# Patient Record
Sex: Female | Born: 1985 | Race: Black or African American | Hispanic: No | Smoking: Former smoker
Health system: Southern US, Community
[De-identification: ages and names within clinical notes are randomized; demographics above are authoritative.]

## PROBLEM LIST (undated history)

## (undated) DIAGNOSIS — I499 Cardiac arrhythmia, unspecified: Secondary | ICD-10-CM

## (undated) DIAGNOSIS — I4891 Unspecified atrial fibrillation: Secondary | ICD-10-CM

## (undated) HISTORY — PX: BREAST LUMPECTOMY: SHX2

---

## 2020-12-11 ENCOUNTER — Other Ambulatory Visit (HOSPITAL_COMMUNITY): Payer: Self-pay | Admitting: Neurology

## 2020-12-11 DIAGNOSIS — R519 Headache, unspecified: Secondary | ICD-10-CM

## 2020-12-11 DIAGNOSIS — H538 Other visual disturbances: Secondary | ICD-10-CM

## 2020-12-11 DIAGNOSIS — I480 Paroxysmal atrial fibrillation: Secondary | ICD-10-CM

## 2020-12-11 DIAGNOSIS — G08 Intracranial and intraspinal phlebitis and thrombophlebitis: Secondary | ICD-10-CM

## 2020-12-25 ENCOUNTER — Ambulatory Visit (HOSPITAL_COMMUNITY): Admission: RE | Admit: 2020-12-25 | Payer: 59 | Source: Ambulatory Visit

## 2020-12-25 ENCOUNTER — Ambulatory Visit (HOSPITAL_COMMUNITY)
Admission: RE | Admit: 2020-12-25 | Discharge: 2020-12-25 | Disposition: A | Discharge status: Home / Self Care | Payer: 59 | Source: Ambulatory Visit | Attending: Neurology | Admitting: Neurology

## 2020-12-25 ENCOUNTER — Encounter (HOSPITAL_COMMUNITY): Payer: Self-pay

## 2020-12-25 DIAGNOSIS — R519 Headache, unspecified: Secondary | ICD-10-CM

## 2020-12-25 DIAGNOSIS — G08 Intracranial and intraspinal phlebitis and thrombophlebitis: Secondary | ICD-10-CM

## 2020-12-25 DIAGNOSIS — I480 Paroxysmal atrial fibrillation: Secondary | ICD-10-CM

## 2020-12-25 DIAGNOSIS — H538 Other visual disturbances: Secondary | ICD-10-CM

## 2021-11-12 ENCOUNTER — Ambulatory Visit: Payer: 59 | Admitting: General Surgery

## 2021-11-12 ENCOUNTER — Encounter: Payer: Self-pay | Admitting: General Surgery

## 2021-11-12 ENCOUNTER — Other Ambulatory Visit: Payer: Self-pay

## 2021-11-12 VITALS — BP 107/73 | HR 100 | Temp 98.9°F | Resp 14 | Ht 66.0 in | Wt 203.0 lb

## 2021-11-12 DIAGNOSIS — K439 Ventral hernia without obstruction or gangrene: Secondary | ICD-10-CM | POA: Diagnosis not present

## 2021-11-12 NOTE — Progress Notes (Signed)
Jamie Spears; [id number]; 11/23/1985 ? ? ?HPI ?Patient is a 35-year-old black female who was referred to my care by Rochelle Muse for evaluation and treatment of a ventral hernia.  Patient has had a hernia just superior to the her umbilicus over the past 3 years.  It is causing her increasing discomfort and is made worse with straining.  She was seen by another surgeon for repair several years ago.  She denies any nausea or vomiting. ?She states she does have a history of paroxysmal atrial fibrillation, though she has not not felt any recent palpitations.  She is not on any treatment. ?History reviewed. No pertinent past medical history. ? ?History reviewed. No pertinent surgical history. ? ?History reviewed. No pertinent family history. ? ?Current Outpatient Medications on File Prior to Visit  ?Medication Sig Dispense Refill  ? Magnesium 500 MG TABS Take 500 mg by mouth daily.    ? ?No current facility-administered medications on file prior to visit.  ? ? ?Allergies  ?Allergen Reactions  ? Acetaminophen Anaphylaxis  ? Codeine Anaphylaxis  ? ? ?Social History  ? ?Substance and Sexual Activity  ?Alcohol Use Never  ? ? ?Social History  ? ?Tobacco Use  ?Smoking Status Former  ? Types: Cigarettes  ? Passive exposure: Current  ?Smokeless Tobacco Never  ? ? ?Review of Systems  ?Constitutional: Negative.   ?HENT:  Positive for ear pain.   ?Eyes: Negative.   ?Respiratory:  Positive for shortness of breath.   ?Cardiovascular: Negative.   ?Gastrointestinal:  Positive for abdominal pain and heartburn.  ?Genitourinary: Negative.   ?Musculoskeletal: Negative.   ?Skin: Negative.   ?Neurological:  Positive for headaches.  ?Endo/Heme/Allergies: Negative.   ?Psychiatric/Behavioral: Negative.    ? ?Objective  ? ?Vitals:  ? 11/12/21 1308  ?BP: 107/73  ?Pulse: 100  ?Resp: 14  ?Temp: 98.9 ?F (37.2 ?C)  ?SpO2: 96%  ? ? ?Physical Exam ?Vitals reviewed.  ?Constitutional:   ?   Appearance: Normal appearance. She is not ill-appearing.   ?HENT:  ?   Head: Normocephalic and atraumatic.  ?Cardiovascular:  ?   Rate and Rhythm: Normal rate and regular rhythm.  ?   Heart sounds: Normal heart sounds. No murmur heard. ?  No friction rub. No gallop.  ?Pulmonary:  ?   Effort: Pulmonary effort is normal. No respiratory distress.  ?   Breath sounds: Normal breath sounds. No stridor. No wheezing, rhonchi or rales.  ?Abdominal:  ?   General: Bowel sounds are normal. There is no distension.  ?   Palpations: Abdomen is soft. There is no mass.  ?   Tenderness: There is no abdominal tenderness. There is no guarding or rebound.  ?   Hernia: A hernia is present.  ?   Comments: 3 cm reducible supraumbilical hernia.  This may be in continuity with the base of the umbilicus.  ?Skin: ?   General: Skin is warm and dry.  ?Neurological:  ?   Mental Status: She is alert and oriented to person, place, and time.  ? ? ?Assessment  ?Ventral hernia ?Plan  ?Patient will call to schedule a ventral herniorrhaphy with mesh.  The risks and benefits of the procedure including bleeding, infection, mesh use, and the possibility of recurrence of the hernia were fully explained to the patient, who gave informed consent. ?

## 2021-11-15 NOTE — H&P (Signed)
Jamie Spears; [id number]; 11/01/1985 ? ? ?HPI ?Patient is a 36 year old black female who was referred to my care by [facility] for evaluation and treatment of a ventral hernia.  Patient has had a hernia just superior to the her umbilicus over the past 3 years.  It is causing her increasing discomfort and is made worse with straining.  She was seen by another surgeon for repair several years ago.  She denies any nausea or vomiting. ?She states she does have a history of paroxysmal atrial fibrillation, though she has not not felt any recent palpitations.  She is not on any treatment. ?History reviewed. No pertinent past medical history. ? ?History reviewed. No pertinent surgical history. ? ?History reviewed. No pertinent family history. ? ?Current Outpatient Medications on File Prior to Visit  ?Medication Sig Dispense Refill  ? Magnesium 500 MG TABS Take 500 mg by mouth daily.    ? ?No current facility-administered medications on file prior to visit.  ? ? ?Allergies  ?Allergen Reactions  ? Acetaminophen Anaphylaxis  ? Codeine Anaphylaxis  ? ? ?Social History  ? ?Substance and Sexual Activity  ?Alcohol Use Never  ? ? ?Social History  ? ?Tobacco Use  ?Smoking Status Former  ? Types: Cigarettes  ? Passive exposure: Current  ?Smokeless Tobacco Never  ? ? ?Review of Systems  ?Constitutional: Negative.   ?HENT:  Positive for ear pain.   ?Eyes: Negative.   ?Respiratory:  Positive for shortness of breath.   ?Cardiovascular: Negative.   ?Gastrointestinal:  Positive for abdominal pain and heartburn.  ?Genitourinary: Negative.   ?Musculoskeletal: Negative.   ?Skin: Negative.   ?Neurological:  Positive for headaches.  ?Endo/Heme/Allergies: Negative.   ?Psychiatric/Behavioral: Negative.    ? ?Objective  ? ?Vitals:  ? 11/12/21 1308  ?BP: 107/73  ?Pulse: 100  ?Resp: 14  ?Temp: 98.9 ?F (37.2 ?C)  ?SpO2: 96%  ? ? ?Physical Exam ?Vitals reviewed.  ?Constitutional:   ?   Appearance: Normal appearance. She is not ill-appearing.   ?HENT:  ?   Head: Normocephalic and atraumatic.  ?Cardiovascular:  ?   Rate and Rhythm: Normal rate and regular rhythm.  ?   Heart sounds: Normal heart sounds. No murmur heard. ?  No friction rub. No gallop.  ?Pulmonary:  ?   Effort: Pulmonary effort is normal. No respiratory distress.  ?   Breath sounds: Normal breath sounds. No stridor. No wheezing, rhonchi or rales.  ?Abdominal:  ?   General: Bowel sounds are normal. There is no distension.  ?   Palpations: Abdomen is soft. There is no mass.  ?   Tenderness: There is no abdominal tenderness. There is no guarding or rebound.  ?   Hernia: A hernia is present.  ?   Comments: 3 cm reducible supraumbilical hernia.  This may be in continuity with the base of the umbilicus.  ?Skin: ?   General: Skin is warm and dry.  ?Neurological:  ?   Mental Status: She is alert and oriented to person, place, and time.  ? ? ?Assessment  ?Ventral hernia ?Plan  ?Patient will call to schedule a ventral herniorrhaphy with mesh.  The risks and benefits of the procedure including bleeding, infection, mesh use, and the possibility of recurrence of the hernia were fully explained to the patient, who gave informed consent. ?

## 2021-11-29 NOTE — Patient Instructions (Signed)
? ? ? ? ? ? ? Jamie Spears ? 11/29/2021  ?  ? @PREFPERIOPPHARMACY @ ? ? Your procedure is scheduled on  12/04/2021. ? ? [facility]  0600  A.M. ? ? Call this number if you have problems the morning of surgery: ? [phone] ? ? Remember: ? Do not eat or drink after midnight. ?  ? ?  Use your inhaler before you come and bring your rescue inhaler with you. ? ?  ? Take these medicines the morning of surgery with A SIP OF WATER  ? ?                                       None. ?  ? ? Do not wear jewelry, make-up or nail polish. ? Do not wear lotions, powders, or perfumes, or deodorant. ? Do not shave 48 hours prior to surgery.  Men may shave face and neck. ? Do not bring valuables to the hospital. ? [facility] is not responsible for any belongings or valuables. ? ?Contacts, dentures or bridgework may not be worn into surgery.  Leave your suitcase in the car.  After surgery it may be brought to your room. ? ?For patients admitted to the hospital, discharge time will be determined by your treatment team. ? ?Patients discharged the day of surgery will not be allowed to drive home and must have someone with them for 24 hours.  ? ? ?Special instructions:   DO NOT smoke tobacco or vape for 24 hours before your procedure. ? ?Please read over the following fact sheets that you were given. ?Coughing and Deep Breathing, Surgical Site Infection Prevention, Anesthesia Post-op Instructions, and Care and Recovery After Surgery ?  ? ? ? Open Hernia Repair, Adult, Care After ?What can I expect after the procedure? ?After the procedure, it is common to have: ?Mild discomfort. ?Slight bruising. ?Mild swelling. ?Pain in the belly (abdomen). ?A small amount of blood from the cut from surgery (incision). ?Follow these instructions at home: ?Your doctor may give you more specific instructions. If you have problems, call your doctor. ?Medicines ?Take over-the-counter and prescription medicines only as told by your  doctor. ?If told, take steps to prevent problems with pooping (constipation). You may need to: ?Drink enough fluid to keep your pee (urine) pale yellow. ?Take medicines. You will be told what medicines to take. ?Eat foods that are high in fiber. These include beans, whole grains, and fresh fruits and vegetables. ?Limit foods that are high in fat and sugar. These include fried or sweet foods. ?Ask your doctor if you should avoid driving or using machines while you are taking your medicine. ?Incision care ? ?Follow instructions from your doctor about how to take care of your incision. Make sure you: ?Wash your hands with soap and water for at least 20 seconds before and after you change your bandage (dressing). If you cannot use soap and water, use hand sanitizer. ?Change your bandage. ?Leave stitches or skin glue in place for at least 2 weeks. ?Leave tape strips alone unless you are told to take them off. You may trim the edges of the tape strips if they curl up. ?Check your incision every day for signs of infection. Check for: ?More redness, swelling, or pain. ?More fluid or blood. ?Warmth. ?Pus or a bad smell. ?Wear loose, soft clothing while your incision heals. ?Activity ? ?  Rest as told by your doctor. ?Do not lift anything that is heavier than 10 lb (4.5 kg), or the limit that you are told. ?Do not play contact sports until your doctor says that this is safe. ?If you were given a sedative during your procedure, do not drive or use machines until your doctor says that it is safe. A sedative is a medicine that helps you relax. ?Return to your normal activities when your doctor says that it is safe. ?General instructions ?Do not take baths, swim, or use a hot tub. Ask your doctor about taking showers or sponge baths. ?Hold a pillow over your belly when you cough or sneeze. This helps with pain. ?Do not smoke or use any products that contain nicotine or tobacco. If you need help quitting, ask your doctor. ?Keep all  follow-up visits. ?Contact a doctor if: ?You have any of these signs of infection in or around your incision: ?More redness, swelling, or pain. ?More fluid or blood. ?Warmth. ?Pus. ?A bad smell. ?You have a fever or chills. ?You have blood in your poop (stool). ?You have not pooped (had a bowel movement) in 2-3 days. ?Medicine does not help your pain. ?Get help right away if: ?You have chest pain, or you are short of breath. ?You feel faint or light-headed. ?You have very bad pain. ?You vomit and your pain is worse. ?You have pain, swelling, or redness in a leg. ?These symptoms may be an emergency. Get help right away. Call your local emergency services (911 in the [place] ?Do not wait to see if the symptoms will go away. ?Do not drive yourself to the hospital. ?Summary ?After this procedure, it is common to have mild discomfort, slight bruising, and mild swelling. ?Follow instructions from your doctor about how to take care of your cut from surgery (incision). Check every day for signs of infection. ?Do not lift heavy objects or play contact sports until your doctor says it is safe. ?Return to your normal activities as told by your doctor. ?This information is not intended to replace advice given to you by your health care provider. Make sure you discuss any questions you have with your health care provider. ?Document Revised: 02/20/2020 Document Reviewed: 02/20/2020 ?Elsevier Patient Education ? 2023 [employer] ?General Anesthesia, Adult, Care After ?This sheet gives you information about how to care for yourself after your procedure. Your health care provider may also give you more specific instructions. If you have problems or questions, contact your health care provider. ?What can I expect after the procedure? ?After the procedure, the following side effects are common: ?Pain or discomfort at the IV site. ?Nausea. ?Vomiting. ?Sore throat. ?Trouble concentrating. ?Feeling cold or chills. ?Feeling weak or  tired. ?Sleepiness and fatigue. ?Soreness and body aches. These side effects can affect parts of the body that were not involved in surgery. ?Follow these instructions at home: ?For the time period you were told by your health care provider: ? ?Rest. ?Do not participate in activities where you could fall or become injured. ?Do not drive or use machinery. ?Do not drink alcohol. ?Do not take sleeping pills or medicines that cause drowsiness. ?Do not make important decisions or sign legal documents. ?Do not take care of children on your own. ?Eating and drinking ?Follow any instructions from your health care provider about eating or drinking restrictions. ?When you feel hungry, start by eating small amounts of foods that are soft and easy to digest (bland), such as toast. Gradually return  to your regular diet. ?Drink enough fluid to keep your urine pale yellow. ?If you vomit, rehydrate by drinking water, juice, or clear broth. ?General instructions ?If you have sleep apnea, surgery and certain medicines can increase your risk for breathing problems. Follow instructions from your health care provider about wearing your sleep device: ?Anytime you are sleeping, including during daytime naps. ?While taking prescription pain medicines, sleeping medicines, or medicines that make you drowsy. ?Have a responsible adult stay with you for the time you are told. It is important to have someone help care for you until you are awake and alert. ?Return to your normal activities as told by your health care provider. Ask your health care provider what activities are safe for you. ?Take over-the-counter and prescription medicines only as told by your health care provider. ?If you smoke, do not smoke without supervision. ?Keep all follow-up visits as told by your health care provider. This is important. ?Contact a health care provider if: ?You have nausea or vomiting that does not get better with medicine. ?You cannot eat or drink  without vomiting. ?You have pain that does not get better with medicine. ?You are unable to pass urine. ?You develop a skin rash. ?You have a fever. ?You have redness around your IV site that gets worse. ?Get help right

## 2021-12-02 ENCOUNTER — Encounter (HOSPITAL_COMMUNITY)
Admission: RE | Admit: 2021-12-02 | Discharge: 2021-12-02 | Disposition: A | Discharge status: Home / Self Care | Payer: 59 | Source: Ambulatory Visit | Attending: General Surgery | Admitting: General Surgery

## 2021-12-02 ENCOUNTER — Encounter (HOSPITAL_COMMUNITY): Payer: Self-pay

## 2021-12-02 VITALS — BP 109/79 | HR 100 | Temp 98.9°F | Resp 16 | Ht 66.0 in | Wt 203.0 lb

## 2021-12-02 DIAGNOSIS — Z01818 Encounter for other preprocedural examination: Secondary | ICD-10-CM | POA: Insufficient documentation

## 2021-12-02 HISTORY — DX: Unspecified atrial fibrillation: I48.91

## 2021-12-02 HISTORY — DX: Cardiac arrhythmia, unspecified: I49.9

## 2021-12-02 LAB — POCT PREGNANCY, URINE: Preg Test, Ur: NEGATIVE

## 2021-12-03 DIAGNOSIS — K439 Ventral hernia without obstruction or gangrene: Secondary | ICD-10-CM | POA: Insufficient documentation

## 2021-12-04 ENCOUNTER — Other Ambulatory Visit: Payer: Self-pay

## 2021-12-04 ENCOUNTER — Ambulatory Visit (HOSPITAL_COMMUNITY): Payer: 59 | Admitting: Anesthesiology

## 2021-12-04 ENCOUNTER — Ambulatory Visit (HOSPITAL_BASED_OUTPATIENT_CLINIC_OR_DEPARTMENT_OTHER): Payer: 59 | Admitting: Anesthesiology

## 2021-12-04 ENCOUNTER — Ambulatory Visit (HOSPITAL_COMMUNITY)
Admission: RE | Admit: 2021-12-04 | Discharge: 2021-12-04 | Disposition: A | Discharge status: Home / Self Care | Payer: 59 | Source: Ambulatory Visit | Attending: General Surgery | Admitting: General Surgery

## 2021-12-04 ENCOUNTER — Encounter (HOSPITAL_COMMUNITY): Payer: Self-pay | Admitting: General Surgery

## 2021-12-04 ENCOUNTER — Encounter (HOSPITAL_COMMUNITY)
Admission: RE | Disposition: A | Discharge status: Home / Self Care | Payer: Self-pay | Source: Ambulatory Visit | Attending: General Surgery

## 2021-12-04 DIAGNOSIS — I480 Paroxysmal atrial fibrillation: Secondary | ICD-10-CM | POA: Insufficient documentation

## 2021-12-04 DIAGNOSIS — J449 Chronic obstructive pulmonary disease, unspecified: Secondary | ICD-10-CM | POA: Insufficient documentation

## 2021-12-04 DIAGNOSIS — Z87891 Personal history of nicotine dependence: Secondary | ICD-10-CM | POA: Diagnosis not present

## 2021-12-04 DIAGNOSIS — K439 Ventral hernia without obstruction or gangrene: Secondary | ICD-10-CM | POA: Diagnosis present

## 2021-12-04 HISTORY — PX: VENTRAL HERNIA REPAIR: SHX424

## 2021-12-04 SURGERY — REPAIR, HERNIA, VENTRAL
Anesthesia: General | Site: Abdomen

## 2021-12-04 MED ORDER — FENTANYL CITRATE PF 50 MCG/ML IJ SOSY: 50.0000 ug | PREFILLED_SYRINGE | INTRAMUSCULAR | Status: DC | PRN

## 2021-12-04 MED ORDER — CHLORHEXIDINE GLUCONATE 0.12 % MT SOLN
15.0000 mL | Freq: Once | OROMUCOSAL | Status: AC
  Administered 2021-12-04: 15 mL via OROMUCOSAL

## 2021-12-04 MED ORDER — PROPOFOL 10 MG/ML IV BOLUS
INTRAVENOUS | Status: AC
  Filled 2021-12-04: qty 20

## 2021-12-04 MED ORDER — BUPIVACAINE LIPOSOME 1.3 % IJ SUSP
INTRAMUSCULAR | Status: DC | PRN
  Administered 2021-12-04: 20 mL

## 2021-12-04 MED ORDER — MIDAZOLAM HCL 2 MG/2ML IJ SOLN
INTRAMUSCULAR | Status: DC | PRN
  Administered 2021-12-04: 2 mg via INTRAVENOUS

## 2021-12-04 MED ORDER — LIDOCAINE HCL (PF) 2 % IJ SOLN
INTRAMUSCULAR | Status: AC
  Filled 2021-12-04: qty 5

## 2021-12-04 MED ORDER — ROCURONIUM BROMIDE 10 MG/ML (PF) SYRINGE
PREFILLED_SYRINGE | INTRAVENOUS | Status: AC
  Filled 2021-12-04: qty 10

## 2021-12-04 MED ORDER — MIDAZOLAM HCL 2 MG/2ML IJ SOLN
INTRAMUSCULAR | Status: AC
  Filled 2021-12-04: qty 2

## 2021-12-04 MED ORDER — CHLORHEXIDINE GLUCONATE CLOTH 2 % EX PADS: 6.0000 | MEDICATED_PAD | Freq: Once | CUTANEOUS | Status: DC

## 2021-12-04 MED ORDER — DEXAMETHASONE SODIUM PHOSPHATE 4 MG/ML IJ SOLN
INTRAMUSCULAR | Status: DC | PRN
  Administered 2021-12-04: 5 mg via INTRAVENOUS

## 2021-12-04 MED ORDER — SUGAMMADEX SODIUM 200 MG/2ML IV SOLN
INTRAVENOUS | Status: DC | PRN
  Administered 2021-12-04: 200 mg via INTRAVENOUS

## 2021-12-04 MED ORDER — ORAL CARE MOUTH RINSE: 15.0000 mL | Freq: Once | OROMUCOSAL | Status: AC

## 2021-12-04 MED ORDER — PROPOFOL 10 MG/ML IV BOLUS
INTRAVENOUS | Status: DC | PRN
  Administered 2021-12-04: 200 mg via INTRAVENOUS

## 2021-12-04 MED ORDER — ONDANSETRON HCL 4 MG/2ML IJ SOLN
4.0000 mg | Freq: Once | INTRAMUSCULAR | Status: AC
Start: 2021-12-04 — End: 2021-12-04
  Administered 2021-12-04: 4 mg via INTRAVENOUS
  Filled 2021-12-04: qty 2

## 2021-12-04 MED ORDER — FENTANYL CITRATE (PF) 250 MCG/5ML IJ SOLN
INTRAMUSCULAR | Status: DC | PRN
  Administered 2021-12-04 (×3): 50 ug via INTRAVENOUS
  Administered 2021-12-04: 100 ug via INTRAVENOUS

## 2021-12-04 MED ORDER — KETOROLAC TROMETHAMINE 30 MG/ML IJ SOLN
INTRAMUSCULAR | Status: DC | PRN
  Administered 2021-12-04: 30 mg via INTRAVENOUS

## 2021-12-04 MED ORDER — 0.9 % SODIUM CHLORIDE (POUR BTL) OPTIME
TOPICAL | Status: DC | PRN
  Administered 2021-12-04: 1000 mL

## 2021-12-04 MED ORDER — CEFAZOLIN SODIUM-DEXTROSE 2-4 GM/100ML-% IV SOLN
2.0000 g | INTRAVENOUS | Status: AC
  Administered 2021-12-04: 2 g via INTRAVENOUS
  Filled 2021-12-04: qty 100

## 2021-12-04 MED ORDER — ONDANSETRON HCL 4 MG/2ML IJ SOLN
INTRAMUSCULAR | Status: DC | PRN
  Administered 2021-12-04: 4 mg via INTRAVENOUS

## 2021-12-04 MED ORDER — LIDOCAINE 2% (20 MG/ML) 5 ML SYRINGE
INTRAMUSCULAR | Status: DC | PRN
  Administered 2021-12-04: 100 mg via INTRAVENOUS

## 2021-12-04 MED ORDER — FENTANYL CITRATE (PF) 250 MCG/5ML IJ SOLN
INTRAMUSCULAR | Status: AC
  Filled 2021-12-04: qty 5

## 2021-12-04 MED ORDER — NAPROXEN 500 MG PO TABS: 500.0000 mg | ORAL_TABLET | Freq: Two times a day (BID) | ORAL | 1 refills | Status: AC

## 2021-12-04 MED ORDER — LACTATED RINGERS IV SOLN
INTRAVENOUS | Status: DC
  Administered 2021-12-04: 1000 mL via INTRAVENOUS

## 2021-12-04 MED ORDER — KETOROLAC TROMETHAMINE 30 MG/ML IJ SOLN
INTRAMUSCULAR | Status: AC
  Filled 2021-12-04: qty 1

## 2021-12-04 MED ORDER — ROCURONIUM BROMIDE 10 MG/ML (PF) SYRINGE
PREFILLED_SYRINGE | INTRAVENOUS | Status: DC | PRN
  Administered 2021-12-04: 50 mg via INTRAVENOUS

## 2021-12-04 MED ORDER — DEXAMETHASONE SODIUM PHOSPHATE 10 MG/ML IJ SOLN
INTRAMUSCULAR | Status: AC
  Filled 2021-12-04: qty 1

## 2021-12-04 MED ORDER — ONDANSETRON HCL 4 MG/2ML IJ SOLN
INTRAMUSCULAR | Status: AC
Start: 2021-12-04 — End: ?
  Filled 2021-12-04: qty 2

## 2021-12-04 SURGICAL SUPPLY — 36 items
ADH SKN CLS APL DERMABOND .7 (GAUZE/BANDAGES/DRESSINGS) ×1
APL PRP STRL LF DISP 70% ISPRP (MISCELLANEOUS) ×1
BLADE SURG 15 STRL LF DISP TIS (BLADE) IMPLANT
BLADE SURG 15 STRL SS (BLADE) ×2
BLADE SURG SZ11 CARB STEEL (BLADE) ×1 IMPLANT
CHLORAPREP W/TINT 26 (MISCELLANEOUS) ×2 IMPLANT
CLOTH BEACON ORANGE TIMEOUT ST (SAFETY) ×2 IMPLANT
COVER LIGHT HANDLE STERIS (MISCELLANEOUS) ×4 IMPLANT
DERMABOND ADVANCED (GAUZE/BANDAGES/DRESSINGS) ×1
DERMABOND ADVANCED .7 DNX12 (GAUZE/BANDAGES/DRESSINGS) IMPLANT
ELECT REM PT RETURN 9FT ADLT (ELECTROSURGICAL) ×2
ELECTRODE REM PT RTRN 9FT ADLT (ELECTROSURGICAL) ×1 IMPLANT
GAUZE 4X4 16PLY ~~LOC~~+RFID DBL (SPONGE) ×1 IMPLANT
GAUZE SPONGE 4X4 12PLY STRL (GAUZE/BANDAGES/DRESSINGS) ×2 IMPLANT
GLOVE BIOGEL PI IND STRL 7.0 (GLOVE) ×2 IMPLANT
GLOVE BIOGEL PI INDICATOR 7.0 (GLOVE) ×3
GLOVE SURG SS PI 6.5 STRL IVOR (GLOVE) ×1 IMPLANT
GLOVE SURG SS PI 7.0 STRL IVOR (GLOVE) ×1 IMPLANT
GLOVE SURG SS PI 7.5 STRL IVOR (GLOVE) ×2 IMPLANT
GOWN STRL REUS W/TWL LRG LVL3 (GOWN DISPOSABLE) ×5 IMPLANT
INST SET MAJOR GENERAL (KITS) ×2 IMPLANT
KIT TURNOVER KIT A (KITS) ×2 IMPLANT
LIGASURE IMPACT 36 18CM CVD LR (INSTRUMENTS) ×1 IMPLANT
MANIFOLD NEPTUNE II (INSTRUMENTS) ×2 IMPLANT
MESH VENTRALEX ST 1-7/10 CRC S (Mesh General) ×1 IMPLANT
NDL HYPO 21X1.5 SAFETY (NEEDLE) ×1 IMPLANT
NEEDLE HYPO 21X1.5 SAFETY (NEEDLE) ×2 IMPLANT
NS IRRIG 1000ML POUR BTL (IV SOLUTION) ×2 IMPLANT
PACK MAJOR ABDOMINAL (CUSTOM PROCEDURE TRAY) ×2 IMPLANT
PAD ARMBOARD 7.5X6 YLW CONV (MISCELLANEOUS) ×2 IMPLANT
SET BASIN LINEN APH (SET/KITS/TRAYS/PACK) ×2 IMPLANT
SUT ETHIBOND NAB MO 7 #0 18IN (SUTURE) ×1 IMPLANT
SUT MNCRL AB 4-0 PS2 18 (SUTURE) ×1 IMPLANT
SUT VIC AB 3-0 SH 27 (SUTURE) ×2
SUT VIC AB 3-0 SH 27X BRD (SUTURE) ×1 IMPLANT
SYR 20ML LL LF (SYRINGE) ×3 IMPLANT

## 2021-12-04 NOTE — Op Note (Signed)
Patient:  MEARLE AVILEZ ? ?DOB:  02/27/1986 ? ?MRN:  [id number] ? ? ?Preop Diagnosis: Ventral hernia ? ?Postop Diagnosis: Same ? ?Procedure: Ventral herniorrhaphy with mesh ? ?Surgeon: Aviva Signs, MD ? ?Anes: General endotracheal ? ?Indications: Patient is a 36 year old black female who presents with a supraumbilical ventral hernia.  The risks and benefits of the procedure including bleeding, infection, mesh use, and the possibility of recurrence of the hernia were fully explained to the patient, who gave informed consent. ? ?Procedure note: Patient was placed in supine position.  After induction of general endotracheal anesthesia, the abdomen was prepped and draped using usual sterile technique with ChloraPrep.  Surgical site confirmation was performed. ? ?An incision was made just superior to the umbilicus.  This was taken down to the fascia.  The patient had a hernia defect which had falciform tissue emanating through it.  This was excised to the base of the hernia sac using the LigaSure.  Care was taken to avoid any bowel.  The resultant defect was 1.5 cm in its greatest diameter.  A 4.3 cm Bard Ventralax ST patch was then inserted and secured to the fascia using 0 Ethibond interrupted sutures.  The overlying fascia was reapproximated transversely using 0 Ethibond interrupted sutures.  The subcutaneous layer was reapproximated using 3-0 Vicryl interrupted sutures.  Exparel was instilled into the surrounding wound.  The skin was closed using a 4-0 Monocryl subcuticular suture.  Dermabond was applied. ? ?All tape and needle counts were correct at the end of the procedure.  The patient was extubated in the operating room and transferred to PACU in stable condition. ? ?Complications: None ? ?EBL: Minimal ? ?Specimen: None ? ? ?  ?

## 2021-12-04 NOTE — Interval H&P Note (Signed)
History and Physical Interval Note: ? ?12/04/2021 ?7:15 AM ? ?Jamie Spears  has presented today for surgery, with the diagnosis of VENTRAL HERNIA <3CM.  The various methods of treatment have been discussed with the patient and family. After consideration of risks, benefits and other options for treatment, the patient has consented to  Procedure(s): ?HERNIA REPAIR VENTRAL ADULT W/ MESH (N/A) as a surgical intervention.  The patient's history has been reviewed, patient examined, no change in status, stable for surgery.  I have reviewed the patient's chart and labs.  Questions were answered to the patient's satisfaction.   ? ? ?Aviva Signs ? ? ?

## 2021-12-04 NOTE — Anesthesia Procedure Notes (Signed)
Procedure Name: Intubation ?Date/Time: 12/04/2021 7:38 AM ?Performed by: Orlie Dakin, CRNA ?Pre-anesthesia Checklist: Patient identified, Emergency Drugs available, Suction available and Patient being monitored ?Patient Re-evaluated:Patient Re-evaluated prior to induction ?Oxygen Delivery Method: Circle system utilized ?Preoxygenation: Pre-oxygenation with 100% oxygen ?Induction Type: IV induction ?Ventilation: Mask ventilation without difficulty ?Laryngoscope Size: Mac and 4 ?Grade View: Grade II ?Tube type: Oral ?Tube size: 7.0 mm ?Number of attempts: 1 ?Airway Equipment and Method: Stylet ?Placement Confirmation: ETT inserted through vocal cords under direct vision, positive ETCO2 and breath sounds checked- equal and bilateral ?Secured at: 22 cm ?Tube secured with: Tape ?Dental Injury: Teeth and Oropharynx as per pre-operative assessment  ?Comments: 4x4s bite block used. ? ? ? ? ?

## 2021-12-04 NOTE — Anesthesia Preprocedure Evaluation (Signed)
Anesthesia Evaluation  ?Patient identified by MRN, date of birth, ID band ?Patient awake ? ? ? ?Reviewed: ?Allergy & Precautions, NPO status , Patient's Chart, lab work & pertinent test results ? ?Airway ?Mallampati: II ? ?TM Distance: >3 FB ?Neck ROM: Full ? ? ? Dental ? ?(+) Dental Advisory Given, Missing, Caps ?  ?Pulmonary ?neg COPD,  COPD inhaler, former smoker,  ?  ?Pulmonary exam normal ?breath sounds clear to auscultation ? ? ? ? ? ? Cardiovascular ?Normal cardiovascular exam+ dysrhythmias  ?Rhythm:Regular Rate:Normal ? ? ?  ?Neuro/Psych ?negative neurological ROS ? negative psych ROS  ? GI/Hepatic ?negative GI ROS, Neg liver ROS,   ?Endo/Other  ?negative endocrine ROS ? Renal/GU ?negative Renal ROS  ?negative genitourinary ?  ?Musculoskeletal ?negative musculoskeletal ROS ?(+)  ? Abdominal ?  ?Peds ?negative pediatric ROS ?(+)  Hematology ?negative hematology ROS ?(+)   ?Anesthesia Other Findings ? ? Reproductive/Obstetrics ?negative OB ROS ? ?  ? ? ? ? ? ? ? ? ? ? ? ? ? ?  ?  ? ? ? ? ? ? ? ?Anesthesia Physical ?Anesthesia Plan ? ?ASA: 2 ? ?Anesthesia Plan: General  ? ?Post-op Pain Management: Dilaudid IV  ? ?Induction: Intravenous ? ?PONV Risk Score and Plan: 4 or greater and Ondansetron, Dexamethasone and Midazolam ? ?Airway Management Planned: LMA and Oral ETT ? ?Additional Equipment:  ? ?Intra-op Plan:  ? ?Post-operative Plan: Extubation in OR ? ?Informed Consent: I have reviewed the patients History and Physical, chart, labs and discussed the procedure including the risks, benefits and alternatives for the proposed anesthesia with the patient or authorized representative who has indicated his/her understanding and acceptance.  ? ? ? ?Dental advisory given ? ?Plan Discussed with: CRNA and Surgeon ? ?Anesthesia Plan Comments:   ? ? ? ? ?Anesthesia Quick Evaluation ? ?

## 2021-12-04 NOTE — Addendum Note (Signed)
Addendum  created 12/04/21 1340 by Molli Barrows, MD  ? Clinical Note Signed  ?  ?

## 2021-12-04 NOTE — Anesthesia Postprocedure Evaluation (Addendum)
Anesthesia Post Note ? ?Patient: Jamie Spears ? ?Procedure(s) Performed: HERNIA REPAIR VENTRAL ADULT W/ MESH (Abdomen) ? ?Patient location during evaluation: Phase II ?Anesthesia Type: General ?Level of consciousness: awake and alert and oriented ?Pain management: pain level controlled ?Vital Signs Assessment: post-procedure vital signs reviewed and stable ?Respiratory status: spontaneous breathing, nonlabored ventilation and respiratory function stable ?Cardiovascular status: blood pressure returned to baseline and stable ?Postop Assessment: no apparent nausea or vomiting ?Anesthetic complications: no ? ? ?No notable events documented. ? ? ?Last Vitals:  ?Vitals:  ? 12/04/21 0845 12/04/21 0855  ?BP:  (!) 117/94  ?Pulse:  85  ?Resp:  18  ?Temp:  36.5 ?C  ?SpO2: 100% 98%  ?  ?Last Pain:  ?Vitals:  ? 12/04/21 0855  ?TempSrc: Oral  ?PainSc: 8   ? ? ?  ?  ?  ?  ?  ?  ? ?Shayn Madole C Mykela Mewborn ? ? ? ? ?

## 2021-12-04 NOTE — Transfer of Care (Signed)
Immediate Anesthesia Transfer of Care Note ? ?Patient: Jamie Spears ? ?Procedure(s) Performed: HERNIA REPAIR VENTRAL ADULT W/ MESH (Abdomen) ? ?Patient Location: PACU ? ?Anesthesia Type:General ? ?Level of Consciousness: awake and alert  ? ?Airway & Oxygen Therapy: Patient Spontanous Breathing and Patient connected to face mask oxygen ? ?Post-op Assessment: Report given to RN and Post -op Vital signs reviewed and stable ? ?Post vital signs: Reviewed and stable ? ?Last Vitals:  ?Vitals Value Taken Time  ?BP    ?Temp    ?Pulse    ?Resp    ?SpO2    ? ? ?Last Pain:  ?Vitals:  ? 12/04/21 0655  ?TempSrc: Oral  ?PainSc: 0-No pain  ?   ? ?Patients Stated Pain Goal: 9 (12/04/21 8466) ? ?Complications: No notable events documented. ?

## 2021-12-05 ENCOUNTER — Encounter (HOSPITAL_COMMUNITY): Payer: Self-pay | Admitting: General Surgery

## 2021-12-19 ENCOUNTER — Encounter: Payer: Self-pay | Admitting: General Surgery

## 2021-12-19 ENCOUNTER — Ambulatory Visit (INDEPENDENT_AMBULATORY_CARE_PROVIDER_SITE_OTHER): Payer: 59 | Admitting: General Surgery

## 2021-12-19 VITALS — BP 108/76 | HR 97 | Temp 98.5°F | Resp 12 | Ht 66.0 in | Wt 204.0 lb

## 2021-12-19 DIAGNOSIS — Z09 Encounter for follow-up examination after completed treatment for conditions other than malignant neoplasm: Secondary | ICD-10-CM

## 2021-12-19 NOTE — Progress Notes (Signed)
Subjective:     Jamie Spears  Patient here for postoperative visit, status post umbilical herniorrhaphy with mesh.  She is doing well.  She has no significant complaints. Objective:    BP 108/76   Pulse 97   Temp 98.5 F (36.9 C) (Oral)   Resp 12   Ht 5\' 6"  (1.676 m)   Wt 204 lb (92.5 kg)   LMP  (LMP Unknown) Comment: Urine Pregnancy Negative 12/02/2021  SpO2 96%   BMI 32.93 kg/m   General:  alert, cooperative, and no distress  Abdomen soft, incision healing well.     Assessment:    Doing well postoperatively.    Plan:   May increase activity as able.  Follow-up here as needed.

## 2021-12-31 ENCOUNTER — Encounter (HOSPITAL_COMMUNITY): Payer: Self-pay | Admitting: Emergency Medicine

## 2021-12-31 ENCOUNTER — Emergency Department (HOSPITAL_COMMUNITY)
Admission: EM | Admit: 2021-12-31 | Discharge: 2021-12-31 | Discharge status: Against Medical Advice | Payer: 59 | Attending: Emergency Medicine | Admitting: Emergency Medicine

## 2021-12-31 ENCOUNTER — Emergency Department (HOSPITAL_COMMUNITY): Payer: 59

## 2021-12-31 ENCOUNTER — Other Ambulatory Visit (HOSPITAL_COMMUNITY): Payer: 59

## 2021-12-31 ENCOUNTER — Other Ambulatory Visit: Payer: Self-pay

## 2021-12-31 DIAGNOSIS — G43109 Migraine with aura, not intractable, without status migrainosus: Secondary | ICD-10-CM | POA: Insufficient documentation

## 2021-12-31 DIAGNOSIS — G43909 Migraine, unspecified, not intractable, without status migrainosus: Secondary | ICD-10-CM | POA: Diagnosis not present

## 2021-12-31 DIAGNOSIS — Z20822 Contact with and (suspected) exposure to covid-19: Secondary | ICD-10-CM | POA: Insufficient documentation

## 2021-12-31 DIAGNOSIS — R7309 Other abnormal glucose: Secondary | ICD-10-CM | POA: Diagnosis not present

## 2021-12-31 DIAGNOSIS — R2990 Unspecified symptoms and signs involving the nervous system: Secondary | ICD-10-CM | POA: Diagnosis not present

## 2021-12-31 DIAGNOSIS — H539 Unspecified visual disturbance: Secondary | ICD-10-CM

## 2021-12-31 DIAGNOSIS — R519 Headache, unspecified: Secondary | ICD-10-CM | POA: Diagnosis present

## 2021-12-31 LAB — COMPREHENSIVE METABOLIC PANEL
ALT: 20 U/L (ref 0–44)
AST: 18 U/L (ref 15–41)
Albumin: 3.9 g/dL (ref 3.5–5.0)
Alkaline Phosphatase: 99 U/L (ref 38–126)
Anion gap: 5 (ref 5–15)
BUN: 10 mg/dL (ref 6–20)
CO2: 25 mmol/L (ref 22–32)
Calcium: 9.1 mg/dL (ref 8.9–10.3)
Chloride: 107 mmol/L (ref 98–111)
Creatinine, Ser: 0.88 mg/dL (ref 0.44–1.00)
GFR, Estimated: >60 mL/min (ref >60)
Glucose, Bld: 128 mg/dL — ABNORMAL HIGH (ref 70–99)
Potassium: 4 mmol/L (ref 3.5–5.1)
Sodium: 137 mmol/L (ref 135–145)
Total Bilirubin: 0.2 mg/dL — ABNORMAL LOW (ref 0.3–1.2)
Total Protein: 7.3 g/dL (ref 6.5–8.1)

## 2021-12-31 LAB — DIFFERENTIAL
Abs Immature Granulocytes: 0.03 10*3/uL (ref 0.00–0.07)
Basophils Absolute: 0 10*3/uL (ref 0.0–0.1)
Basophils Relative: 0 %
Eosinophils Absolute: 0.2 10*3/uL (ref 0.0–0.5)
Eosinophils Relative: 2 %
Immature Granulocytes: 0 %
Lymphocytes Relative: 32 %
Lymphs Abs: 2.5 10*3/uL (ref 0.7–4.0)
Monocytes Absolute: 0.4 10*3/uL (ref 0.1–1.0)
Monocytes Relative: 4 %
Neutro Abs: 4.9 10*3/uL (ref 1.7–7.7)
Neutrophils Relative %: 62 %

## 2021-12-31 LAB — RESP PANEL BY RT-PCR (FLU A&B, COVID) ARPGX2
Influenza A by PCR: NEGATIVE
Influenza B by PCR: NEGATIVE
SARS Coronavirus 2 by RT PCR: NEGATIVE

## 2021-12-31 LAB — CBC
HCT: 42.2 % (ref 36.0–46.0)
Hemoglobin: 13.6 g/dL (ref 12.0–15.0)
MCH: 27.9 pg (ref 26.0–34.0)
MCHC: 32.2 g/dL (ref 30.0–36.0)
MCV: 86.5 fL (ref 80.0–100.0)
Platelets: 298 10*3/uL (ref 150–400)
RBC: 4.88 MIL/uL (ref 3.87–5.11)
RDW: 13.3 % (ref 11.5–15.5)
WBC: 7.8 10*3/uL (ref 4.0–10.5)
nRBC: 0 % (ref 0.0–0.2)

## 2021-12-31 LAB — PROTIME-INR
INR: 1 (ref 0.8–1.2)
Prothrombin Time: 12.7 seconds (ref 11.4–15.2)

## 2021-12-31 LAB — APTT: aPTT: 30 seconds (ref 24–36)

## 2021-12-31 LAB — CBG MONITORING, ED: Glucose-Capillary: 122 mg/dL — ABNORMAL HIGH (ref 70–99)

## 2021-12-31 MED ORDER — IOHEXOL 350 MG/ML SOLN: 80.0000 mL | Freq: Once | INTRAVENOUS | Status: DC | PRN

## 2021-12-31 MED ORDER — KETOROLAC TROMETHAMINE 15 MG/ML IJ SOLN
15.0000 mg | Freq: Four times a day (QID) | INTRAMUSCULAR | Status: DC
  Administered 2021-12-31: 15 mg via INTRAVENOUS
  Filled 2021-12-31: qty 1

## 2021-12-31 MED ORDER — IOHEXOL 350 MG/ML SOLN
75.0000 mL | Freq: Once | INTRAVENOUS | Status: AC | PRN
  Administered 2021-12-31: 75 mL via INTRAVENOUS

## 2021-12-31 MED ORDER — SODIUM CHLORIDE 0.9 % IV BOLUS
1000.0000 mL | Freq: Once | INTRAVENOUS | Status: AC
  Administered 2021-12-31: 1000 mL via INTRAVENOUS

## 2021-12-31 MED ORDER — DEXAMETHASONE SODIUM PHOSPHATE 10 MG/ML IJ SOLN
8.0000 mg | Freq: Once | INTRAMUSCULAR | Status: AC
  Administered 2021-12-31: 8 mg via INTRAVENOUS
  Filled 2021-12-31: qty 1

## 2021-12-31 MED ORDER — DIPHENHYDRAMINE HCL 50 MG/ML IJ SOLN: 25.0000 mg | Freq: Four times a day (QID) | INTRAMUSCULAR | Status: DC

## 2021-12-31 MED ORDER — PROCHLORPERAZINE EDISYLATE 10 MG/2ML IJ SOLN
10.0000 mg | Freq: Four times a day (QID) | INTRAMUSCULAR | Status: DC
  Administered 2021-12-31: 10 mg via INTRAVENOUS
  Filled 2021-12-31: qty 2

## 2021-12-31 MED ORDER — MAGNESIUM SULFATE IN D5W 1-5 GM/100ML-% IV SOLN
1.0000 g | Freq: Two times a day (BID) | INTRAVENOUS | Status: DC
  Administered 2021-12-31: 1 g via INTRAVENOUS
  Filled 2021-12-31: qty 100

## 2021-12-31 NOTE — Consult Note (Signed)
[facility] Telemedicine Consult   Requesting Provider: Octaviano Glow Consult Participants: Myself, patient, bedside nurse, atrium nurse Location of the provider: [facility] Location of the patient: [place] [facility]  This consult was provided via telemedicine with 2-way video and audio communication. The patient/family was informed that care would be provided in this way and agreed to receive care in this manner.    Chief Complaint: Eye pain, headache, blurry vision, dizziness   HPI: This is a 36 year old woman with a past medical history significant for migraine headaches on magnesium, reported atrial fibrillation (remote, not on any anticoagulation), presenting with headache, left-sided vision deficits and feeling unsteady  She had a similar presentation in May 2020 which resolved with migraine cocktails but reports that this presentation feels different  LKW: 3:20 PM tpa given?: No, MRI negative for stroke IR Thrombectomy? No, no LVO Modified Rankin Scale: 0-Completely asymptomatic and back to baseline post- stroke Time of teleneurologist evaluation: 16:41  Exam: Current vital signs: BP 122/83   Pulse 95   Temp 98.3 F (36.8 C) (Oral)   Resp 20   Ht 5\' 6"  (1.676 m)   Wt 93 kg   LMP  (LMP Unknown) Comment: Urine Pregnancy Negative 12/02/2021  SpO2 100%   BMI 33.09 kg/m  Vital signs in last 24 hours: Temp:  [98.3 F (36.8 C)] 98.3 F (36.8 C) (06/13 1622) Pulse Rate:  [95-106] 95 (06/13 1658) Resp:  [20] 20 (06/13 1658) BP: (122)/(83) 122/83 (06/13 1621) SpO2:  [98 %-100 %] 100 % (06/13 1658) Weight:  [93 kg] 93 kg (06/13 1631)   General: Acutely uncomfortable, frequently squinting eyes secondary to head pain Pulmonary: breathing comfortably Cardiac: regular rate and rhythm on monitor    NIH Stroke scale 1A: Level of Consciousness - 0 1B: Ask Month and Age - 0 1C: 'Blink Eyes' & 'Squeeze Hands' - 0 2: Test Horizontal Extraocular Movements - 0 3: Test Visual  Fields - 1, unable to count fingers on the left side 4: Test Facial Palsy - 0 5A: Test Left Arm Motor Drift - 0 5B: Test Right Arm Motor Drift - 0 6A: Test Left Leg Motor Drift - 0 6B: Test Right Leg Motor Drift - 0 7: Test Limb Ataxia - 1 ataxia of the left upper extremity 8: Test Sensation - 0 9: Test Language/Aphasia- 0 10: Test Dysarthria - 0 11: Test Extinction/Inattention - 0 NIHSS score: 2   Imaging Reviewed:   Labs reviewed in epic and pertinent values follow:  Basic Metabolic Panel: Recent Labs  Lab 12/31/21 1632  NA 137  K 4.0  CL 107  CO2 25  GLUCOSE 128*  BUN 10  CREATININE 0.88  CALCIUM 9.1    CBC: Recent Labs  Lab 12/31/21 1632 12/31/21 1634  WBC  --  7.8  NEUTROABS 4.9  --   HGB  --  13.6  HCT  --  42.2  MCV  --  86.5  PLT  --  298    Coagulation Studies: Recent Labs    12/31/21 1632  LABPROT 12.7  INR 1.0     Head CT no acute process MRI brain diffusion negative on my read, final radiology read pending  Assessment: Complex migraine  Recommendations: Follow-up radiology read of full MRI, please contact me if any significant abnormalities noted.  Migraine cocktails (scheduled every 6 hours: Benadryl 25 mg, ketorolac 15 mg, Compazine 10 mg; scheduled every 12 hours: 1 L normal saline and 1 g magnesium sulfate), continue until headache resolution +  1 additional dose  Patient may also be discharged on oral migraine cocktail if she prefers (25-50 mg Benadryl, 800 mg ibuprofen, 5-10 mg compazine, taken altogether with large amounts of water every 6 hours until headache is controlled, not to exceed 3 days for 12 doses a week to avoid development of medication overuse headache) Close outpatient follow-up for consideration of stronger prophylactic medication such as CGRP inhibitor Lesleigh Noe MD-PhD [facility] [phone]   If 8pm-8am, please page neurology on call as listed in [place].  CRITICAL CARE Performed by: Lorenza Chick   Total critical care time: 35 minutes  Critical care time was exclusive of separately billable procedures and treating other patients.  Critical care was necessary to treat or prevent imminent or life-threatening deterioration.  Critical care was time spent personally by me on the following activities: development of treatment plan with patient and/or surrogate as well as nursing, discussions with consultants, evaluation of patient's response to treatment, examination of patient, obtaining history from patient or surrogate, ordering and performing treatments and interventions, ordering and review of laboratory studies, ordering and review of radiographic studies, pulse oximetry and re-evaluation of patient's condition.

## 2021-12-31 NOTE — ED Notes (Addendum)
Beeped and Called CODE STROKE to CT @ 1629

## 2021-12-31 NOTE — ED Triage Notes (Signed)
Pt presents with symptoms of blurred vision, headache, balances issues, that started around 1520 today, LKW today at 1500.

## 2021-12-31 NOTE — ED Notes (Signed)
End Code stroke

## 2021-12-31 NOTE — ED Provider Notes (Signed)
[facility] EMERGENCY DEPARTMENT Provider Note   CSN: [id number] Arrival date & time: 12/31/21  1558     History {Add pertinent medical, surgical, social history, OB history to HPI:1} Chief Complaint  Patient presents with   Dizziness    Jamie Spears is a 36 y.o. female with a history of migraines presenting to ED with a headache, blurred vision, balance difficulty.  She reports abrupt onset of symptoms at 3:20 PM this afternoon while at work.  She has a throbbing pain behind her right eye, reports blurred vision in both her eyes, the right more than the left which she describes "a film over my eyes".  She also reports that her balance has been off, she felt extremely nauseated.  She denies vertigo.  She says she does suffer from migraines but has never had symptoms like this.  Her last migraine was about 2 months ago.  She has no history of stroke or TIA.  HPI     Home Medications Prior to Admission medications   Medication Sig Start Date End Date Taking? Authorizing Provider  albuterol (VENTOLIN HFA) 108 (90 Base) MCG/ACT inhaler Inhale 1-2 puffs into the lungs every 6 (six) hours as needed for wheezing or shortness of breath. 10/24/10   [provider]  Magnesium 500 MG TABS Take 500 mg by mouth daily.    [provider]  naproxen (NAPROSYN) 500 MG tablet Take 1 tablet (500 mg total) by mouth 2 (two) times daily with a meal. 12/04/21 12/04/22  Aviva Signs, MD      Allergies    Acetaminophen and Codeine    Review of Systems   Review of Systems  Physical Exam Updated Vital Signs BP 122/83   Pulse (!) 106   Temp 98.3 F (36.8 C) (Oral)   Resp 20   Ht 5\' 6"  (1.676 m)   Wt 93 kg   LMP  (LMP Unknown) Comment: Urine Pregnancy Negative 12/02/2021  SpO2 98%   BMI 33.09 kg/m  Physical Exam Constitutional:      General: She is not in acute distress. HENT:     Head: Normocephalic and atraumatic.  Eyes:     Conjunctiva/sclera: Conjunctivae normal.      Pupils: Pupils are equal, round, and reactive to light.  Cardiovascular:     Rate and Rhythm: Normal rate and regular rhythm.  Pulmonary:     Effort: Pulmonary effort is normal. No respiratory distress.  Abdominal:     General: There is no distension.     Tenderness: There is no abdominal tenderness.  Skin:    General: Skin is warm and dry.  Neurological:     General: No focal deficit present.     Mental Status: She is alert and oriented to person, place, and time. Mental status is at baseline.     Comments: Peripheral fields are grossly intact Patient is not able to perform finger-nose testing No pronator drift     ED Results / Procedures / Treatments   Labs (all labs ordered are listed, but only abnormal results are displayed) Labs Reviewed  RESP PANEL BY RT-PCR (FLU A&B, COVID) ARPGX2  BASIC METABOLIC PANEL  CBC  URINALYSIS, ROUTINE W REFLEX MICROSCOPIC  ETHANOL  PROTIME-INR  APTT  DIFFERENTIAL  COMPREHENSIVE METABOLIC PANEL  RAPID URINE DRUG SCREEN, HOSP PERFORMED  CBG MONITORING, ED  I-STAT CHEM 8, ED  POC URINE PREG, ED    EKG None  Radiology No results found.  Procedures Procedures  {Document cardiac  monitor, telemetry assessment procedure when appropriate:1}  Medications Ordered in ED Medications - No data to display  ED Course/ Medical Decision Making/ A&P                           Medical Decision Making Amount and/or Complexity of Data Reviewed Labs: ordered. Radiology: ordered.   This patient presents to the ED with concern for abrupt onset of headache, balance difficulty, blurred vision. This involves an extensive number of treatment options, and is a complaint that carries with it a high risk of complications and morbidity.  The differential diagnosis includes cerebellar stroke versus complex migraine versus new onset MS versus other  With a clear onset of symptoms 1 hour prior to my evaluation, 3:20 PM, I have activated the patient as a  code stroke.  She has difficulty with finger-nose testing, and her symptoms may be suggestive of a cerebellar lesion --although complex migraine also remains fairly high on my differential.  She does not demonstrate evidence of large vessel occlusion.  I would defer decision for thrombolytics until discussion with neurology.  Co-morbidities that complicate the patient evaluation: History of migraines places her at high risk for complex migraine  External records from outside source obtained and reviewed including ***  I ordered and personally interpreted labs.  The pertinent results include:  ***  I ordered imaging studies including *** I independently visualized and interpreted imaging which showed *** I agree with the radiologist interpretation  The patient was maintained on a cardiac monitor.  I personally viewed and interpreted the cardiac monitored which showed an underlying rhythm of: ***  Per my interpretation the patient's ECG shows ***  I ordered medication including ***  for *** I have reviewed the patients home medicines and have made adjustments as needed  Test Considered: ***  I requested consultation with the ***,  and discussed lab and imaging findings as well as pertinent plan - they recommend: ***  After the interventions noted above, I reevaluated the patient and found that they have: {resolved/improved/worsened:23923::"improved"}  Social Determinants of Health:***  Dispostion:  After consideration of the diagnostic results and the patients response to treatment, I feel that the patent would benefit from ***.   {Document critical care time when appropriate:1} {Document review of labs and clinical decision tools ie heart score, Chads2Vasc2 etc:1}  {Document your independent review of radiology images, and any outside records:1} {Document your discussion with family members, caretakers, and with consultants:1} {Document social determinants of health affecting pt's  care:1} {Document your decision making why or why not admission, treatments were needed:1} Final Clinical Impression(s) / ED Diagnoses Final diagnoses:  None    Rx / DC Orders ED Discharge Orders     None

## 2021-12-31 NOTE — ED Notes (Signed)
Pt going to MRI

## 2021-12-31 NOTE — Progress Notes (Signed)
Patient returned from CT at 1653.

## 2021-12-31 NOTE — ED Notes (Signed)
Pt in CT.

## 2021-12-31 NOTE — Progress Notes (Signed)
Two diamond earrings removed by patient, placed in ziploc back and handed back to patient. Patient left CT with ziploc bag and earrings.

## 2021-12-31 NOTE — ED Notes (Signed)
Pt requesting to have IV taken out as she wants to leave AMA. Pt states she feels better and does not want to wait for discharge papers. This RN explained the risks of leaving AMA. Pt verbalized understanding.

## 2021-12-31 NOTE — ED Notes (Signed)
Per neuro Doctor. No NIH needed or stroke swallow needs to be done.

## 2021-12-31 NOTE — ED Notes (Signed)
Pt back from MRI 

## 2021-12-31 NOTE — Progress Notes (Signed)
Code stroke activated at 1635. Patient was already seen by provider and in CT at time of cart activation. Dr. Curly Shores connected to telestroke cart for code stroke evaluation at 1441.

## 2021-12-31 NOTE — ED Notes (Signed)
Pt talking with Neuro dr.

## 2022-02-21 ENCOUNTER — Other Ambulatory Visit: Payer: Self-pay | Admitting: Neurology

## 2022-02-21 ENCOUNTER — Other Ambulatory Visit (HOSPITAL_COMMUNITY): Payer: Self-pay | Admitting: Neurology

## 2022-02-21 DIAGNOSIS — R2243 Localized swelling, mass and lump, lower limb, bilateral: Secondary | ICD-10-CM

## 2022-03-03 ENCOUNTER — Ambulatory Visit (HOSPITAL_COMMUNITY): Admission: RE | Admit: 2022-03-03 | Payer: 59 | Source: Ambulatory Visit

## 2022-03-03 ENCOUNTER — Encounter (HOSPITAL_COMMUNITY): Payer: Self-pay

## 2023-03-30 DIAGNOSIS — I4891 Unspecified atrial fibrillation: Secondary | ICD-10-CM | POA: Diagnosis not present

## 2023-03-30 DIAGNOSIS — Z131 Encounter for screening for diabetes mellitus: Secondary | ICD-10-CM | POA: Diagnosis not present

## 2023-03-30 DIAGNOSIS — Z0001 Encounter for general adult medical examination with abnormal findings: Secondary | ICD-10-CM | POA: Diagnosis not present

## 2023-03-30 DIAGNOSIS — E569 Vitamin deficiency, unspecified: Secondary | ICD-10-CM | POA: Diagnosis not present

## 2023-03-30 DIAGNOSIS — J4520 Mild intermittent asthma, uncomplicated: Secondary | ICD-10-CM | POA: Diagnosis not present

## 2023-03-30 DIAGNOSIS — Z720 Tobacco use: Secondary | ICD-10-CM | POA: Diagnosis not present

## 2023-03-30 DIAGNOSIS — Z1329 Encounter for screening for other suspected endocrine disorder: Secondary | ICD-10-CM | POA: Diagnosis not present

## 2023-03-30 DIAGNOSIS — E668 Other obesity: Secondary | ICD-10-CM | POA: Diagnosis not present

## 2023-03-30 DIAGNOSIS — R5383 Other fatigue: Secondary | ICD-10-CM | POA: Diagnosis not present

## 2023-03-30 DIAGNOSIS — Z1159 Encounter for screening for other viral diseases: Secondary | ICD-10-CM | POA: Diagnosis not present

## 2023-06-29 IMAGING — MR MR HEAD W/O CM
12 of 13 series · 41 of 48 positions shown · non-contrast
Comparison: No prior MRI of the head, correlation is made with CT
head 12/31/2021

CLINICAL DATA: Blurred vision, balance difficulty, headache, stroke
suspected

EXAM:
MRI HEAD WITHOUT CONTRAST
TECHNIQUE: Multiplanar, multiecho pulse sequences of the brain and surrounding
structures were obtained without intravenous contrast.

[Series 5: DWI · axial · 4.0mm · 0.88mm/px · z∈[-148,-26]mm · 5 of 36 slices shown (1 of 6)]
[im 1/36]
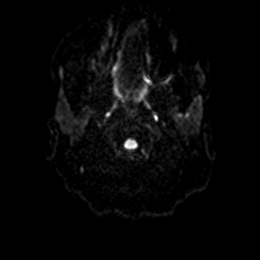
[im 9/36]
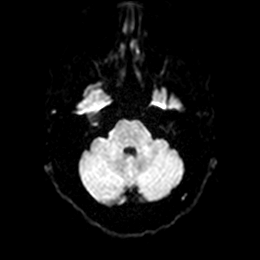
[im 18/36]
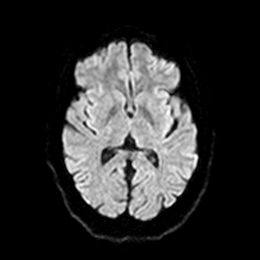
[im 27/36]
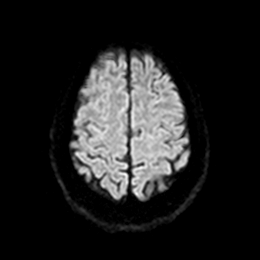
[im 36/36]
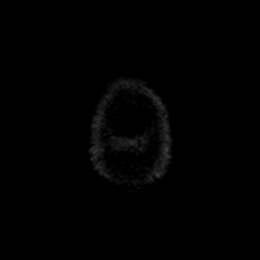

[Series 5: DWI · axial · 4.0mm · 0.88mm/px · z∈[-148,-26]mm · 5 of 36 slices shown (2 of 6)]
[im 1/36]
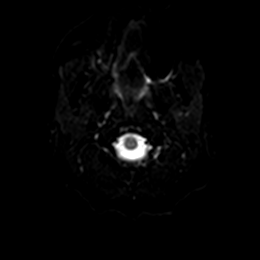
[im 9/36]
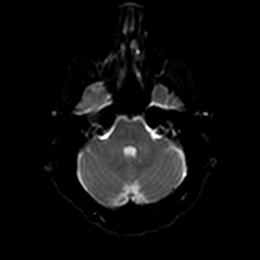
[im 18/36]
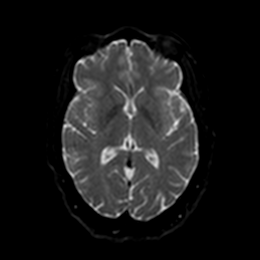
[im 27/36]
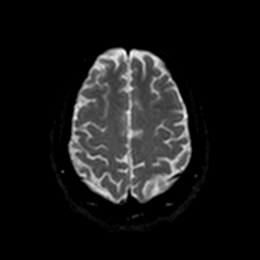
[im 36/36]
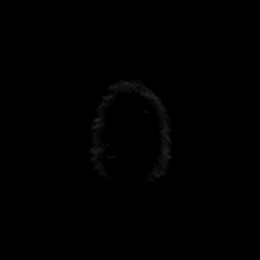

[Series 6: DWI · axial · 4.0mm · 0.88mm/px · z∈[-148,-26]mm · 5 of 36 slices shown (3 of 6)]
[im 1/36]
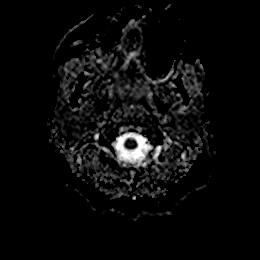
[im 9/36]
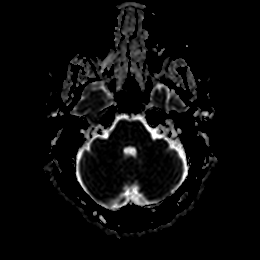
[im 18/36]
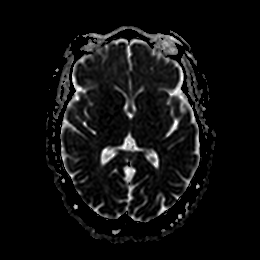
[im 27/36]
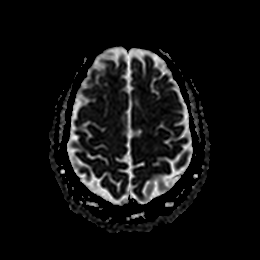
[im 36/36]
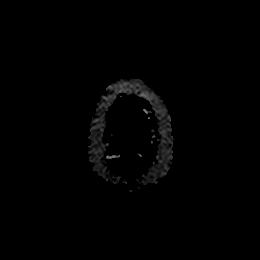

[Series 7: DWI · coronal · 5.0mm · 0.88mm/px · 3 of 28 slices shown (4 of 6)]
[im 1/28]
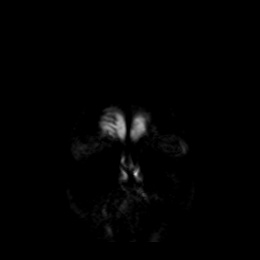
[im 14/28]
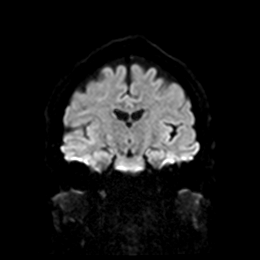
[im 28/28]
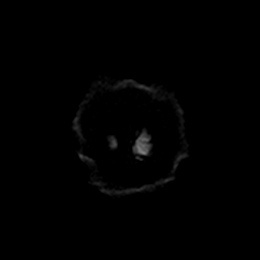

[Series 7: DWI · coronal · 5.0mm · 0.88mm/px · 3 of 28 slices shown (5 of 6)]
[im 1/28]
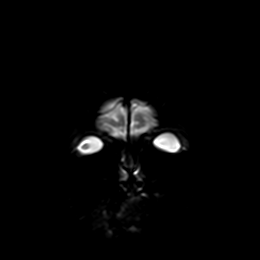
[im 14/28]
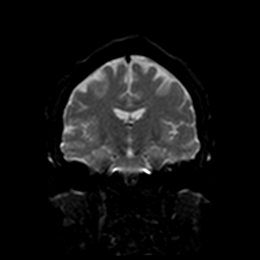
[im 28/28]
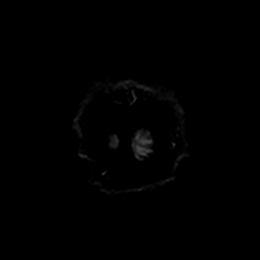

[Series 8: DWI · coronal · 5.0mm · 0.88mm/px · 3 of 28 slices shown (6 of 6)]
[im 1/28]
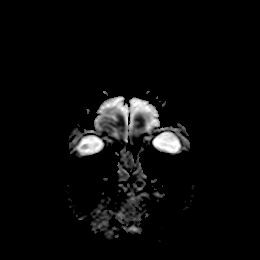
[im 14/28]
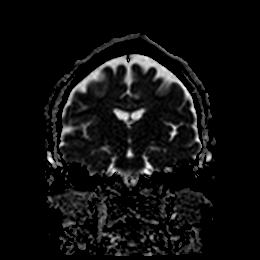
[im 28/28]
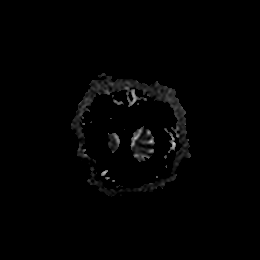

[Series 9: T1 · sagittal · 5.0mm · 0.94mm/px · 3 of 21 slices shown]
[im 1/21]
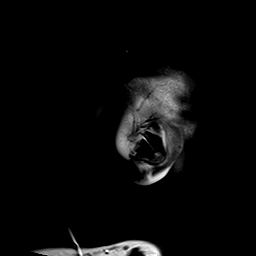
[im 11/21]
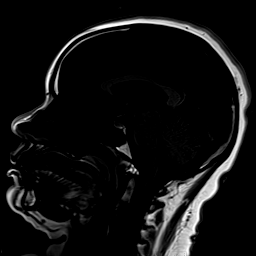
[im 21/21]
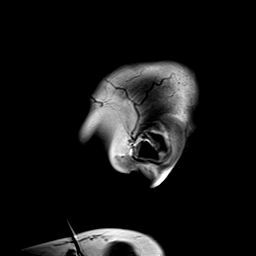

[Series 10: T2 · axial · 5.0mm · 0.72mm/px · z∈[-145,-29]mm · 2 of 20 slices shown (1 of 2)]
[im 1/20]
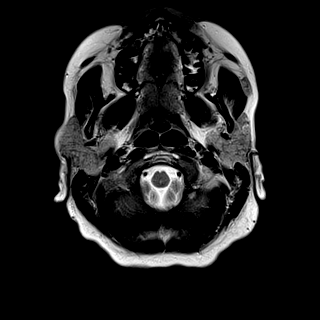
[im 20/20]
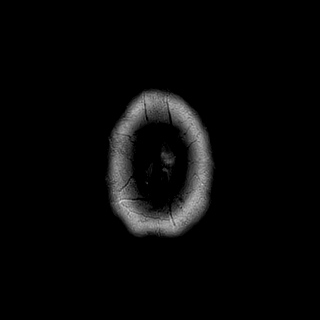

[Series 11: ax hemo · axial · 5.0mm · 0.86mm/px · z∈[-147,-22]mm · 3 of 25 slices shown]
[im 1/25]
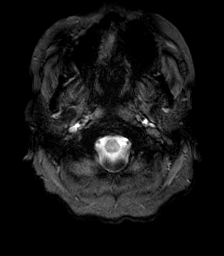
[im 13/25]
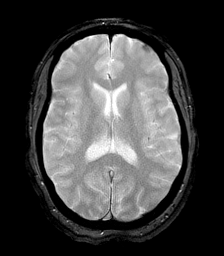
[im 25/25]
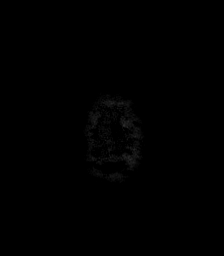

[Series 12: FLAIR · axial · 4.0mm · 0.43mm/px · z∈[-139,-31]mm · 4 of 32 slices shown (1 of 2)]
[im 1/32]
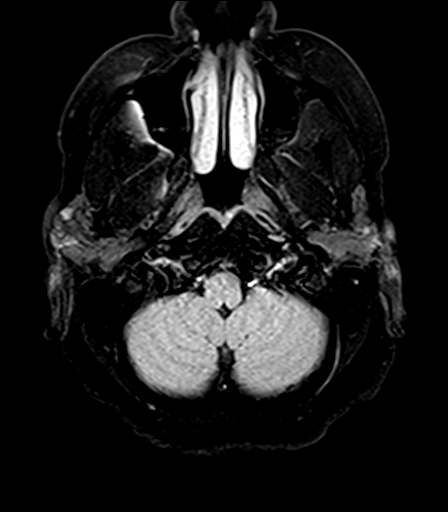
[im 11/32]
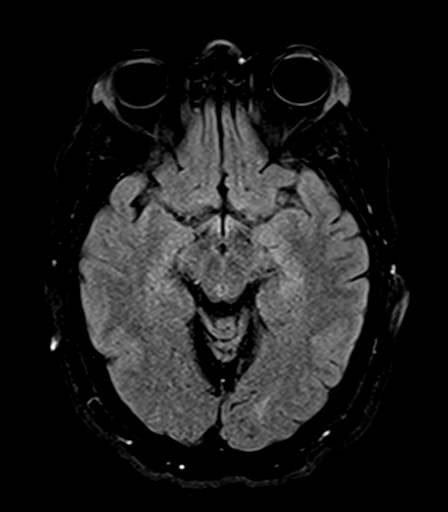
[im 21/32]
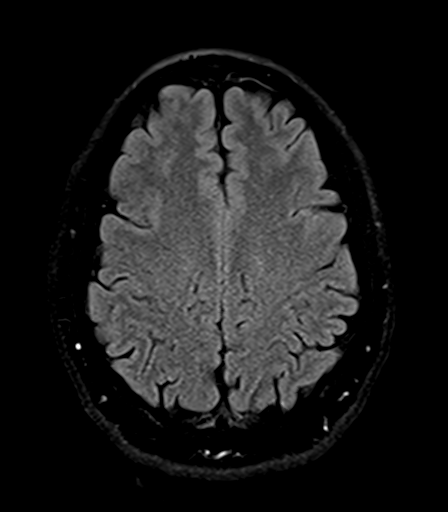
[im 32/32]
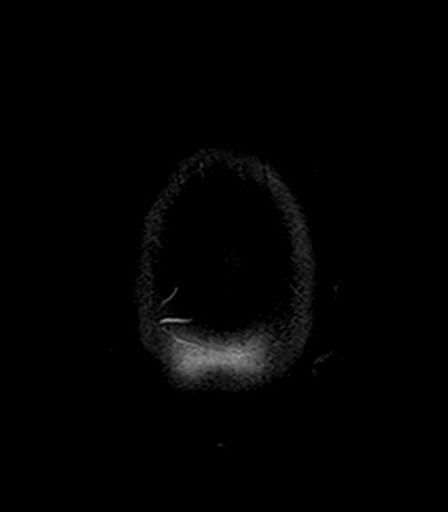

[Series 14: T2 · coronal · 5.0mm · 0.72mm/px · 3 of 26 slices shown (2 of 2)]
[im 1/26]
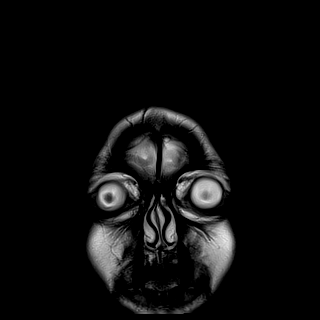
[im 13/26]
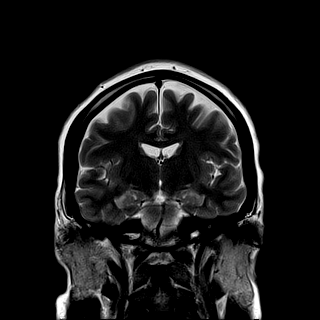
[im 26/26]
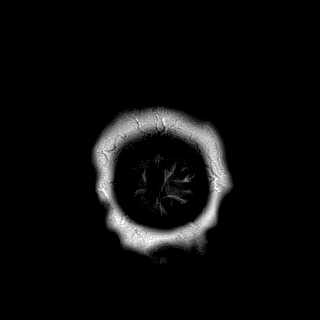

[Series 15: FLAIR · sagittal · 5.0mm · 0.94mm/px · 2 of 18 slices shown (2 of 2)]
[im 1/18]
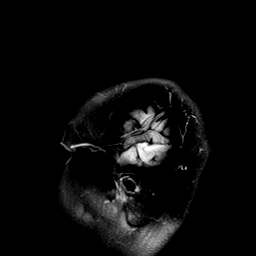
[im 18/18]
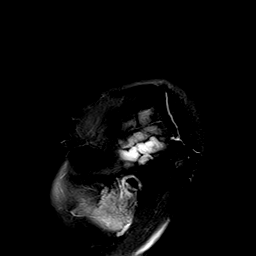

[41 of 48 positions shown; findings below may reference images not displayed]

FINDINGS: Brain: No restricted diffusion to suggest acute or subacute infarct.
No acute hemorrhage, mass, mass effect, or midline shift. No
hydrocephalus or extra-axial collection. No abnormal T2 hyperintense
foci. Normal pituitary. Normal craniocervical junction.

Vascular: Normal flow voids.

Skull and upper cervical spine: Normal marrow signal.

Sinuses/Orbits: No acute finding.

Other: The mastoids are well aerated.
IMPRESSION: No acute intracranial process. In particular, no evidence of
infarct, mass, hemorrhage, or demyelinating disease.

## 2023-06-29 IMAGING — CT CT HEAD CODE STROKE
3 of 4 series · 14 of 47 positions shown, 16 images · non-contrast
Comparison: Head CT 11/27/2020.

CLINICAL DATA: Code stroke. Provided history: Neuro deficit, acute,
stroke suspected. Blurred vision, balance difficulty, headache.



[Series 4: head w o · axial · 0.47mm/px · z∈[+48,+203]mm · 8 of 37 slices shown, 10 images]
[im 3/37  brain]
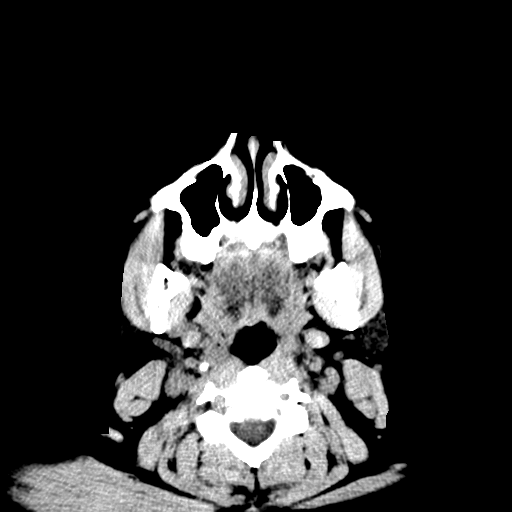
[im 3/37  bone]
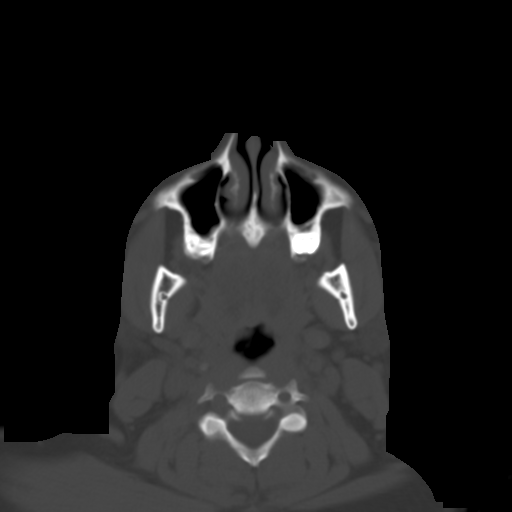
[im 8/37  brain]
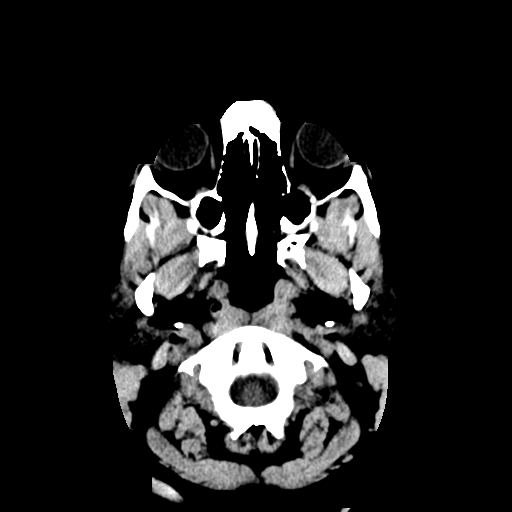
[im 13/37  brain]
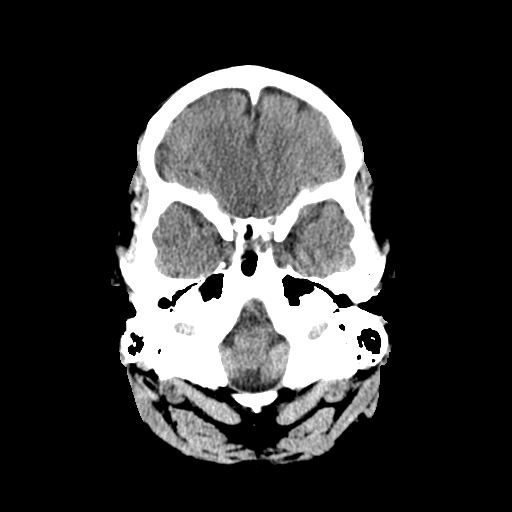
[im 16/37  brain]
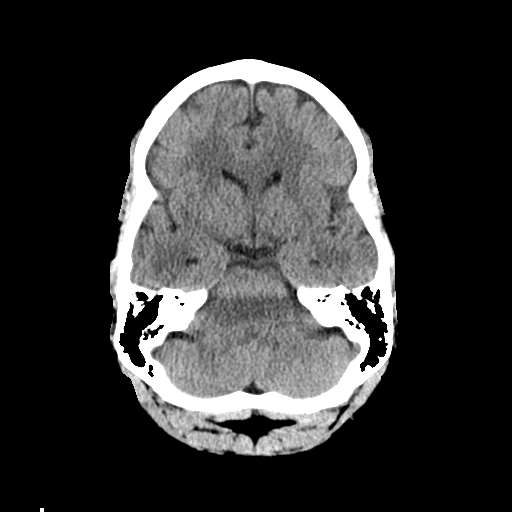
[im 21/37  brain]
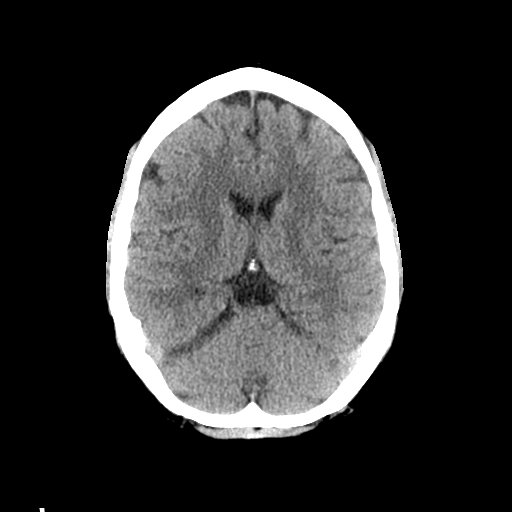
[im 21/37  bone]
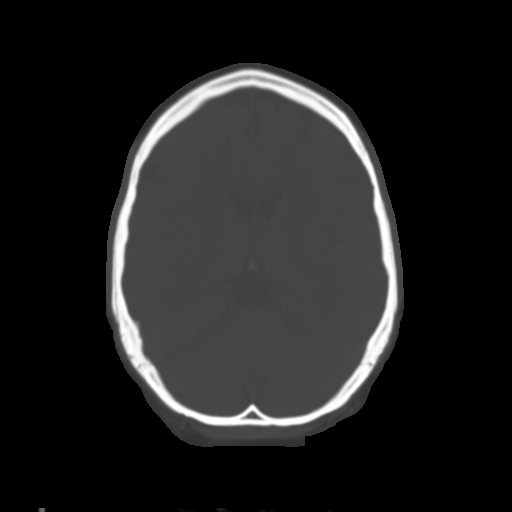
[im 24/37  brain]
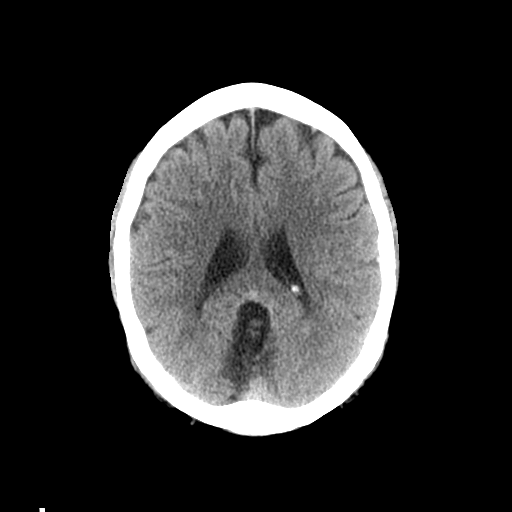
[im 29/37  brain]
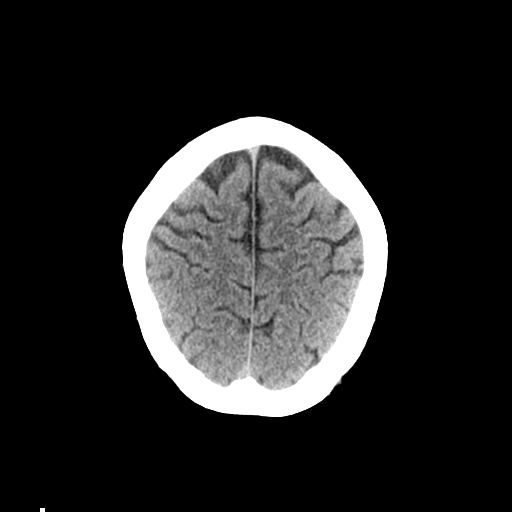
[im 34/37  brain]
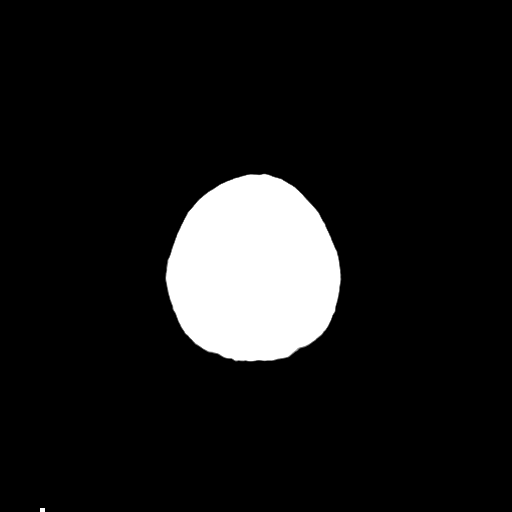

[Series 6: coronal soft · coronal · 0.33mm/px · 3 of 69 slices shown]
[im 23/69  brain]
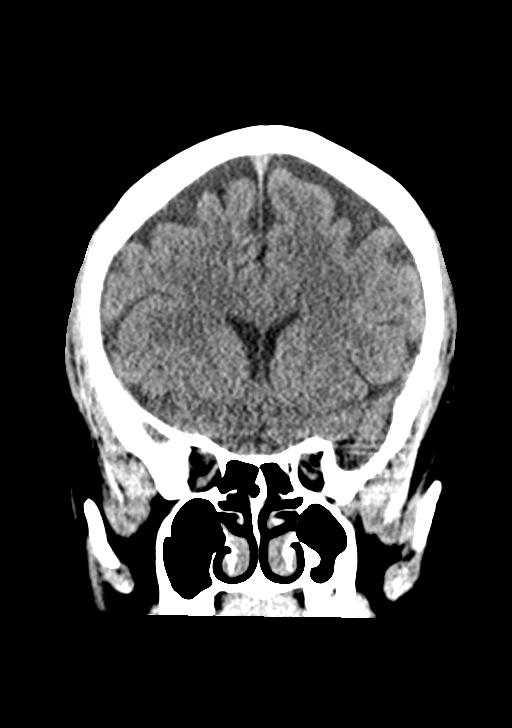
[im 31/69  brain]
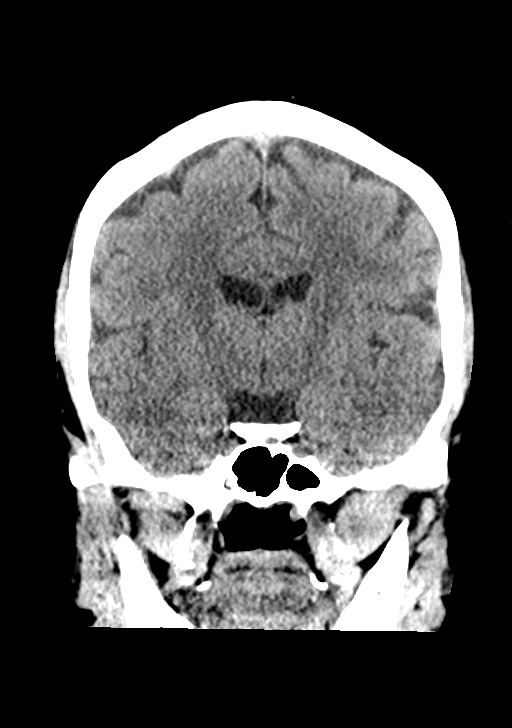
[im 38/69  brain]
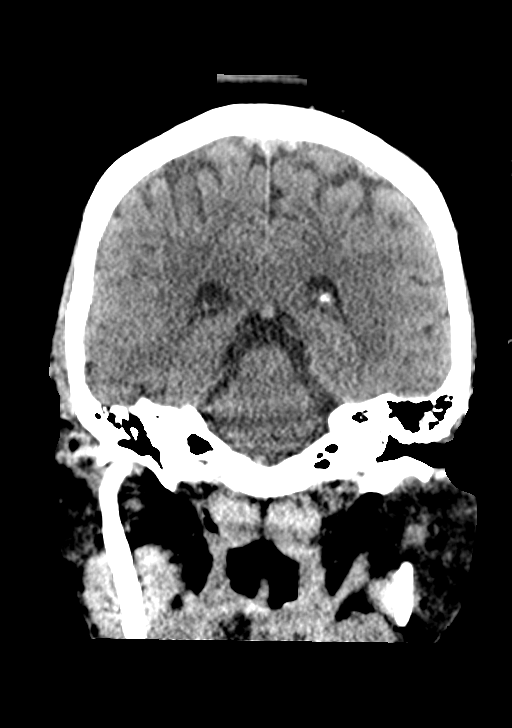

[Series 7: sagittal soft · sagittal · 0.35mm/px · 3 of 67 slices shown]
[im 23/67  brain]
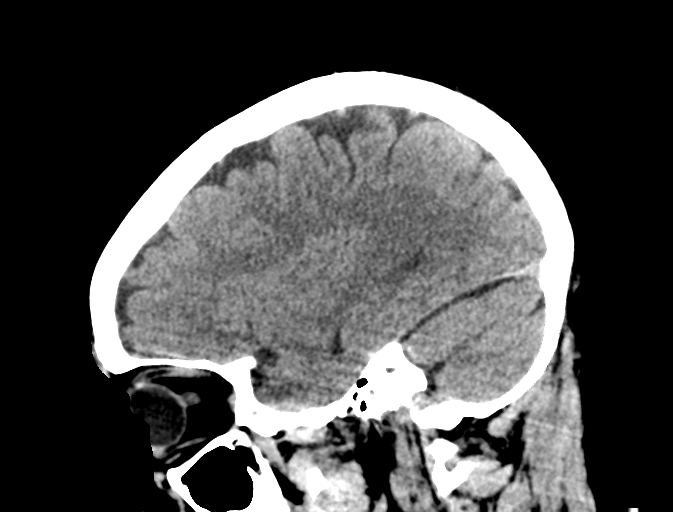
[im 34/67  brain]
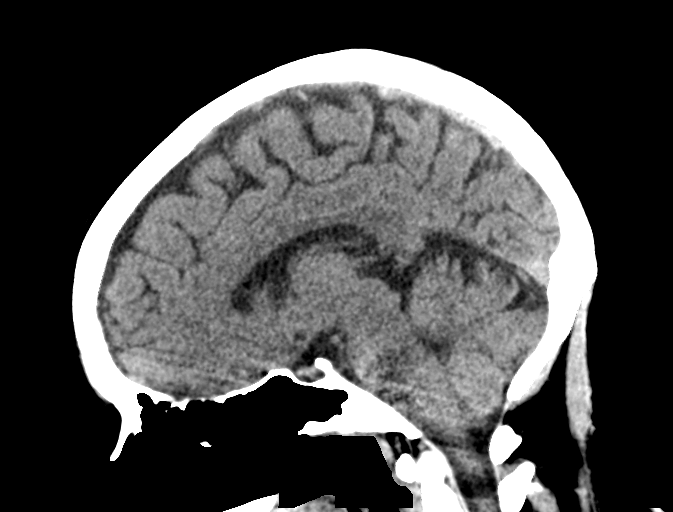
[im 45/67  brain]
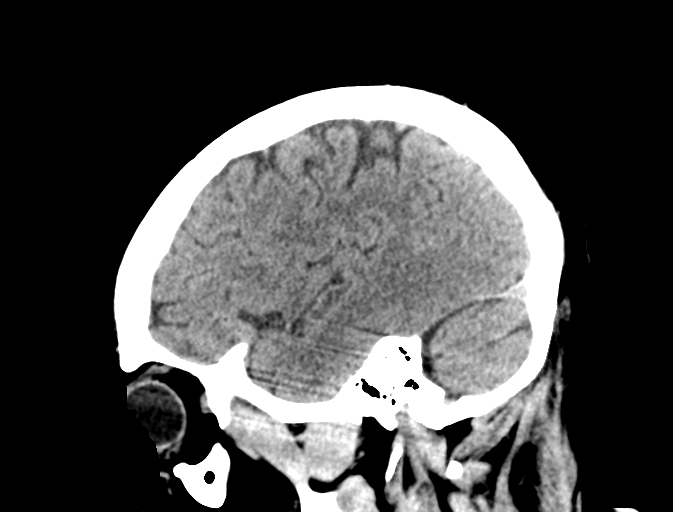

[14 of 47 positions shown; findings below may reference images not displayed]

FINDINGS: Brain:

Cerebral volume is normal.

There is no acute intracranial hemorrhage.

No demarcated cortical infarct.

No extra-axial fluid collection.

No evidence of an intracranial mass.

No midline shift.

Vascular: No hyperdense vessel.

Skull: No fracture or aggressive osseous lesion.

Sinuses/Orbits: No mass or acute finding within the imaged orbits.
No significant paranasal sinus disease at the imaged levels.

ASPECTS (Alberta Stroke Program Early CT Score)

- Ganglionic level infarction (caudate, lentiform nuclei, internal
capsule, insula, M1-M3 cortex): 7

- Supraganglionic infarction (M4-M6 cortex): 3

Total score (0-10 with 10 being normal): 10

These results were called by telephone at the time of interpretation
on 12/31/2021 at [DATE] to provider Dr. Hildebrand, who verbally
acknowledged these results.
IMPRESSION: No evidence of acute intracranial abnormality.
